# Patient Record
Sex: Male | Born: 1985 | Race: White | Hispanic: Yes | Marital: Married
Health system: Southern US, Community
[De-identification: ages and names within clinical notes are randomized; demographics above are authoritative.]

## PROBLEM LIST (undated history)

## (undated) DIAGNOSIS — E785 Hyperlipidemia, unspecified: Secondary | ICD-10-CM

## (undated) DIAGNOSIS — J302 Other seasonal allergic rhinitis: Secondary | ICD-10-CM

## (undated) HISTORY — PX: HERNIA REPAIR: SHX51

## (undated) HISTORY — PX: TONSILLECTOMY: SUR1361

## (undated) HISTORY — DX: Other seasonal allergic rhinitis: J30.2

## (undated) HISTORY — DX: Hyperlipidemia, unspecified: E78.5

---

## 2016-10-27 DIAGNOSIS — Z23 Encounter for immunization: Secondary | ICD-10-CM | POA: Diagnosis not present

## 2016-10-27 DIAGNOSIS — Z0000 Encounter for general adult medical examination without abnormal findings: Secondary | ICD-10-CM | POA: Diagnosis not present

## 2016-10-27 DIAGNOSIS — Z9229 Personal history of other drug therapy: Secondary | ICD-10-CM | POA: Diagnosis not present

## 2016-10-27 DIAGNOSIS — Z136 Encounter for screening for cardiovascular disorders: Secondary | ICD-10-CM | POA: Diagnosis not present

## 2018-04-19 DIAGNOSIS — F4323 Adjustment disorder with mixed anxiety and depressed mood: Secondary | ICD-10-CM | POA: Diagnosis not present

## 2018-05-17 DIAGNOSIS — J301 Allergic rhinitis due to pollen: Secondary | ICD-10-CM | POA: Diagnosis not present

## 2018-05-17 DIAGNOSIS — Z6837 Body mass index (BMI) 37.0-37.9, adult: Secondary | ICD-10-CM | POA: Diagnosis not present

## 2018-05-17 DIAGNOSIS — R000 Tachycardia, unspecified: Secondary | ICD-10-CM | POA: Diagnosis not present

## 2018-05-17 DIAGNOSIS — E785 Hyperlipidemia, unspecified: Secondary | ICD-10-CM | POA: Diagnosis not present

## 2018-05-24 DIAGNOSIS — F4323 Adjustment disorder with mixed anxiety and depressed mood: Secondary | ICD-10-CM | POA: Diagnosis not present

## 2018-08-06 DIAGNOSIS — H9201 Otalgia, right ear: Secondary | ICD-10-CM | POA: Diagnosis not present

## 2018-08-06 DIAGNOSIS — M5410 Radiculopathy, site unspecified: Secondary | ICD-10-CM | POA: Diagnosis not present

## 2018-11-10 DIAGNOSIS — Z0000 Encounter for general adult medical examination without abnormal findings: Secondary | ICD-10-CM | POA: Diagnosis not present

## 2018-11-10 DIAGNOSIS — Z23 Encounter for immunization: Secondary | ICD-10-CM | POA: Diagnosis not present

## 2018-11-10 DIAGNOSIS — E785 Hyperlipidemia, unspecified: Secondary | ICD-10-CM | POA: Diagnosis not present

## 2018-11-10 DIAGNOSIS — J301 Allergic rhinitis due to pollen: Secondary | ICD-10-CM | POA: Diagnosis not present

## 2018-11-10 DIAGNOSIS — Z6838 Body mass index (BMI) 38.0-38.9, adult: Secondary | ICD-10-CM | POA: Diagnosis not present

## 2018-11-10 DIAGNOSIS — Z8249 Family history of ischemic heart disease and other diseases of the circulatory system: Secondary | ICD-10-CM | POA: Diagnosis not present

## 2018-11-10 DIAGNOSIS — R000 Tachycardia, unspecified: Secondary | ICD-10-CM | POA: Diagnosis not present

## 2018-12-21 ENCOUNTER — Ambulatory Visit
Admission: RE | Admit: 2018-12-21 | Discharge: 2018-12-21 | Disposition: A | Discharge status: Home / Self Care | Payer: BC Managed Care – PPO | Source: Ambulatory Visit | Attending: Internal Medicine | Admitting: Internal Medicine

## 2018-12-21 ENCOUNTER — Other Ambulatory Visit: Payer: Self-pay | Admitting: Internal Medicine

## 2018-12-21 DIAGNOSIS — S99921S Unspecified injury of right foot, sequela: Secondary | ICD-10-CM

## 2018-12-21 DIAGNOSIS — M79671 Pain in right foot: Secondary | ICD-10-CM | POA: Diagnosis not present

## 2018-12-21 DIAGNOSIS — S99921A Unspecified injury of right foot, initial encounter: Secondary | ICD-10-CM | POA: Diagnosis not present

## 2019-05-25 DIAGNOSIS — E785 Hyperlipidemia, unspecified: Secondary | ICD-10-CM | POA: Diagnosis not present

## 2019-05-25 DIAGNOSIS — J301 Allergic rhinitis due to pollen: Secondary | ICD-10-CM | POA: Diagnosis not present

## 2019-05-25 DIAGNOSIS — Z79899 Other long term (current) drug therapy: Secondary | ICD-10-CM | POA: Diagnosis not present

## 2019-06-25 ENCOUNTER — Other Ambulatory Visit: Payer: Self-pay

## 2019-06-25 ENCOUNTER — Ambulatory Visit: Payer: Self-pay | Attending: Internal Medicine

## 2019-06-25 DIAGNOSIS — Z23 Encounter for immunization: Secondary | ICD-10-CM

## 2019-06-25 NOTE — Progress Notes (Signed)
   Covid-19 Vaccination Clinic  Name:  Gregory Lucas    MRN: [id number] DOB: 1985/04/01  06/25/2019  [name] was observed post Covid-19 immunization for 15 minutes without incident. He was provided with Vaccine Information Sheet and instruction to access the V-Safe system.   [name] was instructed to call 911 with any severe reactions post vaccine: Marland Kitchen Difficulty breathing  . Swelling of face and throat  . A fast heartbeat  . A bad rash all over body  . Dizziness and weakness   Immunizations Administered    Name Date Dose VIS Date Route   Pfizer COVID-19 Vaccine 06/25/2019 10:27 AM 0.3 mL 02/25/2019 Intramuscular   Manufacturer: [employer], [employer]   Lot: [id number]   NDC: [address]-6

## 2019-07-20 ENCOUNTER — Ambulatory Visit: Payer: Self-pay | Attending: Internal Medicine

## 2019-07-20 DIAGNOSIS — Z23 Encounter for immunization: Secondary | ICD-10-CM

## 2019-07-20 NOTE — Progress Notes (Signed)
   Covid-19 Vaccination Clinic  Name:  Gregory Lucas    MRN: [id number] DOB: 01/16/1986  07/20/2019  [name] was observed post Covid-19 immunization for 15 minutes without incident. He was provided with Vaccine Information Sheet and instruction to access the V-Safe system.   [name] was instructed to call 911 with any severe reactions post vaccine: Marland Kitchen Difficulty breathing  . Swelling of face and throat  . A fast heartbeat  . A bad rash all over body  . Dizziness and weakness   Immunizations Administered    Name Date Dose VIS Date Route   Pfizer COVID-19 Vaccine 07/20/2019  8:46 AM 0.3 mL 05/11/2018 Intramuscular   Manufacturer: [employer], [employer]   Lot: [id number]   NDC: [address]-5

## 2019-11-24 DIAGNOSIS — E785 Hyperlipidemia, unspecified: Secondary | ICD-10-CM | POA: Diagnosis not present

## 2019-11-24 DIAGNOSIS — Z23 Encounter for immunization: Secondary | ICD-10-CM | POA: Diagnosis not present

## 2019-11-24 DIAGNOSIS — Z0000 Encounter for general adult medical examination without abnormal findings: Secondary | ICD-10-CM | POA: Diagnosis not present

## 2020-11-26 DIAGNOSIS — E785 Hyperlipidemia, unspecified: Secondary | ICD-10-CM | POA: Diagnosis not present

## 2020-11-26 DIAGNOSIS — Z0000 Encounter for general adult medical examination without abnormal findings: Secondary | ICD-10-CM | POA: Diagnosis not present

## 2020-11-29 DIAGNOSIS — U071 COVID-19: Secondary | ICD-10-CM | POA: Diagnosis not present

## 2020-11-29 DIAGNOSIS — J018 Other acute sinusitis: Secondary | ICD-10-CM | POA: Diagnosis not present
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
## 2021-08-30 DIAGNOSIS — R030 Elevated blood-pressure reading, without diagnosis of hypertension: Secondary | ICD-10-CM | POA: Diagnosis not present

## 2021-08-30 DIAGNOSIS — M79661 Pain in right lower leg: Secondary | ICD-10-CM | POA: Diagnosis not present

## 2021-08-30 DIAGNOSIS — S81801A Unspecified open wound, right lower leg, initial encounter: Secondary | ICD-10-CM | POA: Diagnosis not present

## 2021-12-07 ENCOUNTER — Emergency Department (HOSPITAL_COMMUNITY): Payer: BC Managed Care – PPO

## 2021-12-07 ENCOUNTER — Encounter (HOSPITAL_COMMUNITY): Payer: Self-pay | Admitting: Emergency Medicine

## 2021-12-07 ENCOUNTER — Emergency Department (HOSPITAL_COMMUNITY)
Admission: EM | Admit: 2021-12-07 | Discharge: 2021-12-07 | Disposition: A | Discharge status: Home / Self Care | Payer: BC Managed Care – PPO | Attending: Emergency Medicine | Admitting: Emergency Medicine

## 2021-12-07 DIAGNOSIS — R109 Unspecified abdominal pain: Secondary | ICD-10-CM | POA: Insufficient documentation

## 2021-12-07 DIAGNOSIS — R918 Other nonspecific abnormal finding of lung field: Secondary | ICD-10-CM | POA: Diagnosis not present

## 2021-12-07 DIAGNOSIS — J40 Bronchitis, not specified as acute or chronic: Secondary | ICD-10-CM | POA: Diagnosis not present

## 2021-12-07 DIAGNOSIS — M545 Low back pain, unspecified: Secondary | ICD-10-CM | POA: Diagnosis not present

## 2021-12-07 DIAGNOSIS — R072 Precordial pain: Secondary | ICD-10-CM | POA: Insufficient documentation

## 2021-12-07 DIAGNOSIS — R0781 Pleurodynia: Secondary | ICD-10-CM | POA: Diagnosis not present

## 2021-12-07 DIAGNOSIS — M533 Sacrococcygeal disorders, not elsewhere classified: Secondary | ICD-10-CM | POA: Diagnosis not present

## 2021-12-07 DIAGNOSIS — Y92410 Unspecified street and highway as the place of occurrence of the external cause: Secondary | ICD-10-CM | POA: Insufficient documentation

## 2021-12-07 DIAGNOSIS — S3993XA Unspecified injury of pelvis, initial encounter: Secondary | ICD-10-CM | POA: Diagnosis not present

## 2021-12-07 LAB — CBC WITH DIFFERENTIAL/PLATELET
Abs Immature Granulocytes: 0.01 10*3/uL (ref 0.00–0.07)
Basophils Absolute: 0 10*3/uL (ref 0.0–0.1)
Basophils Relative: 1 %
Eosinophils Absolute: 0.1 10*3/uL (ref 0.0–0.5)
Eosinophils Relative: 1 %
HCT: 49.4 % (ref 39.0–52.0)
Hemoglobin: 16.2 g/dL (ref 13.0–17.0)
Immature Granulocytes: 0 %
Lymphocytes Relative: 29 %
Lymphs Abs: 1.5 10*3/uL (ref 0.7–4.0)
MCH: 29.5 pg (ref 26.0–34.0)
MCHC: 32.8 g/dL (ref 30.0–36.0)
MCV: 90 fL (ref 80.0–100.0)
Monocytes Absolute: 0.4 10*3/uL (ref 0.1–1.0)
Monocytes Relative: 8 %
Neutro Abs: 3.2 10*3/uL (ref 1.7–7.7)
Neutrophils Relative %: 61 %
Platelets: 224 10*3/uL (ref 150–400)
RBC: 5.49 MIL/uL (ref 4.22–5.81)
RDW: 12.3 % (ref 11.5–15.5)
WBC: 5.2 10*3/uL (ref 4.0–10.5)
nRBC: 0 % (ref 0.0–0.2)

## 2021-12-07 LAB — BASIC METABOLIC PANEL
Anion gap: 6 (ref 5–15)
BUN: 11 mg/dL (ref 6–20)
CO2: 28 mmol/L (ref 22–32)
Calcium: 9.7 mg/dL (ref 8.9–10.3)
Chloride: 106 mmol/L (ref 98–111)
Creatinine, Ser: 1 mg/dL (ref 0.61–1.24)
GFR, Estimated: >60 mL/min (ref >60)
Glucose, Bld: 95 mg/dL (ref 70–99)
Potassium: 3.7 mmol/L (ref 3.5–5.1)
Sodium: 140 mmol/L (ref 135–145)

## 2021-12-07 MED ORDER — METHOCARBAMOL 500 MG PO TABS: 500.0000 mg | ORAL_TABLET | Freq: Two times a day (BID) | ORAL | 0 refills | Status: AC

## 2021-12-07 MED ORDER — OXYCODONE-ACETAMINOPHEN 5-325 MG PO TABS
1.0000 | ORAL_TABLET | Freq: Once | ORAL | Status: AC
  Administered 2021-12-07: 1 via ORAL
  Filled 2021-12-07: qty 1
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
## 2021-12-11 ENCOUNTER — Other Ambulatory Visit: Payer: Self-pay | Admitting: Internal Medicine

## 2021-12-11 ENCOUNTER — Ambulatory Visit
Admission: RE | Admit: 2021-12-11 | Discharge: 2021-12-11 | Disposition: A | Discharge status: Home / Self Care | Payer: BC Managed Care – PPO | Source: Ambulatory Visit | Attending: Internal Medicine | Admitting: Internal Medicine

## 2021-12-11 DIAGNOSIS — R109 Unspecified abdominal pain: Secondary | ICD-10-CM | POA: Diagnosis not present

## 2021-12-11 DIAGNOSIS — K5730 Diverticulosis of large intestine without perforation or abscess without bleeding: Secondary | ICD-10-CM | POA: Diagnosis not present

## 2021-12-11 DIAGNOSIS — S301XXA Contusion of abdominal wall, initial encounter: Secondary | ICD-10-CM | POA: Diagnosis not present

## 2021-12-11 DIAGNOSIS — N289 Disorder of kidney and ureter, unspecified: Secondary | ICD-10-CM | POA: Diagnosis not present

## 2021-12-11 DIAGNOSIS — I700 Atherosclerosis of aorta: Secondary | ICD-10-CM | POA: Diagnosis not present

## 2021-12-11 DIAGNOSIS — M791 Myalgia, unspecified site: Secondary | ICD-10-CM | POA: Diagnosis not present

## 2021-12-11 DIAGNOSIS — M533 Sacrococcygeal disorders, not elsewhere classified: Secondary | ICD-10-CM | POA: Diagnosis not present

## 2021-12-11 MED ORDER — IOPAMIDOL (ISOVUE-300) INJECTION 61%
100.0000 mL | Freq: Once | INTRAVENOUS | Status: AC | PRN
  Administered 2021-12-11: 100 mL via INTRAVENOUS

## 2022-01-16 DIAGNOSIS — M791 Myalgia, unspecified site: Secondary | ICD-10-CM | POA: Diagnosis not present

## 2022-01-16 DIAGNOSIS — Z0000 Encounter for general adult medical examination without abnormal findings: Secondary | ICD-10-CM | POA: Diagnosis not present

## 2022-01-16 DIAGNOSIS — Z23 Encounter for immunization: Secondary | ICD-10-CM | POA: Diagnosis not present

## 2022-02-12 DIAGNOSIS — F411 Generalized anxiety disorder: Secondary | ICD-10-CM | POA: Diagnosis not present

## 2022-02-12 DIAGNOSIS — F330 Major depressive disorder, recurrent, mild: Secondary | ICD-10-CM | POA: Diagnosis not present

## 2022-02-19 DIAGNOSIS — F411 Generalized anxiety disorder: Secondary | ICD-10-CM | POA: Diagnosis not present

## 2022-02-19 DIAGNOSIS — F330 Major depressive disorder, recurrent, mild: Secondary | ICD-10-CM | POA: Diagnosis not present

## 2022-02-20 DIAGNOSIS — F4323 Adjustment disorder with mixed anxiety and depressed mood: Secondary | ICD-10-CM | POA: Diagnosis not present

## 2022-02-26 DIAGNOSIS — F411 Generalized anxiety disorder: Secondary | ICD-10-CM | POA: Diagnosis not present

## 2022-02-26 DIAGNOSIS — F330 Major depressive disorder, recurrent, mild: Secondary | ICD-10-CM | POA: Diagnosis not present

## 2022-03-19 DIAGNOSIS — F411 Generalized anxiety disorder: Secondary | ICD-10-CM | POA: Diagnosis not present

## 2022-03-19 DIAGNOSIS — F330 Major depressive disorder, recurrent, mild: Secondary | ICD-10-CM | POA: Diagnosis not present

## 2022-03-29 DIAGNOSIS — F411 Generalized anxiety disorder: Secondary | ICD-10-CM | POA: Diagnosis not present

## 2022-03-29 DIAGNOSIS — F330 Major depressive disorder, recurrent, mild: Secondary | ICD-10-CM | POA: Diagnosis not present

## 2022-04-05 DIAGNOSIS — F330 Major depressive disorder, recurrent, mild: Secondary | ICD-10-CM | POA: Diagnosis not present

## 2022-04-05 DIAGNOSIS — F411 Generalized anxiety disorder: Secondary | ICD-10-CM | POA: Diagnosis not present

## 2022-04-09 DIAGNOSIS — F330 Major depressive disorder, recurrent, mild: Secondary | ICD-10-CM | POA: Diagnosis not present

## 2022-04-09 DIAGNOSIS — F411 Generalized anxiety disorder: Secondary | ICD-10-CM | POA: Diagnosis not present

## 2022-04-23 DIAGNOSIS — F330 Major depressive disorder, recurrent, mild: Secondary | ICD-10-CM | POA: Diagnosis not present

## 2022-04-23 DIAGNOSIS — E785 Hyperlipidemia, unspecified: Secondary | ICD-10-CM | POA: Diagnosis not present

## 2022-04-23 DIAGNOSIS — F411 Generalized anxiety disorder: Secondary | ICD-10-CM | POA: Diagnosis not present

## 2022-05-03 DIAGNOSIS — F330 Major depressive disorder, recurrent, mild: Secondary | ICD-10-CM | POA: Diagnosis not present

## 2022-05-03 DIAGNOSIS — F411 Generalized anxiety disorder: Secondary | ICD-10-CM | POA: Diagnosis not present

## 2022-05-10 DIAGNOSIS — F411 Generalized anxiety disorder: Secondary | ICD-10-CM | POA: Diagnosis not present

## 2022-05-10 DIAGNOSIS — F330 Major depressive disorder, recurrent, mild: Secondary | ICD-10-CM | POA: Diagnosis not present

## 2022-05-17 DIAGNOSIS — F411 Generalized anxiety disorder: Secondary | ICD-10-CM | POA: Diagnosis not present

## 2022-05-17 DIAGNOSIS — F330 Major depressive disorder, recurrent, mild: Secondary | ICD-10-CM | POA: Diagnosis not present

## 2022-05-21 DIAGNOSIS — F411 Generalized anxiety disorder: Secondary | ICD-10-CM | POA: Diagnosis not present

## 2022-05-21 DIAGNOSIS — F330 Major depressive disorder, recurrent, mild: Secondary | ICD-10-CM | POA: Diagnosis not present

## 2022-05-31 DIAGNOSIS — F411 Generalized anxiety disorder: Secondary | ICD-10-CM | POA: Diagnosis not present

## 2022-05-31 DIAGNOSIS — F330 Major depressive disorder, recurrent, mild: Secondary | ICD-10-CM | POA: Diagnosis not present

## 2022-06-04 DIAGNOSIS — F330 Major depressive disorder, recurrent, mild: Secondary | ICD-10-CM | POA: Diagnosis not present

## 2022-06-04 DIAGNOSIS — F411 Generalized anxiety disorder: Secondary | ICD-10-CM | POA: Diagnosis not present

## 2022-06-18 DIAGNOSIS — F330 Major depressive disorder, recurrent, mild: Secondary | ICD-10-CM | POA: Diagnosis not present

## 2022-06-18 DIAGNOSIS — F411 Generalized anxiety disorder: Secondary | ICD-10-CM | POA: Diagnosis not present

## 2022-06-28 DIAGNOSIS — F330 Major depressive disorder, recurrent, mild: Secondary | ICD-10-CM | POA: Diagnosis not present

## 2022-06-28 DIAGNOSIS — F411 Generalized anxiety disorder: Secondary | ICD-10-CM | POA: Diagnosis not present

## 2022-07-09 DIAGNOSIS — F330 Major depressive disorder, recurrent, mild: Secondary | ICD-10-CM | POA: Diagnosis not present

## 2022-07-09 DIAGNOSIS — F411 Generalized anxiety disorder: Secondary | ICD-10-CM | POA: Diagnosis not present

## 2022-07-23 DIAGNOSIS — F411 Generalized anxiety disorder: Secondary | ICD-10-CM | POA: Diagnosis not present

## 2022-07-23 DIAGNOSIS — F330 Major depressive disorder, recurrent, mild: Secondary | ICD-10-CM | POA: Diagnosis not present

## 2022-07-30 DIAGNOSIS — F411 Generalized anxiety disorder: Secondary | ICD-10-CM | POA: Diagnosis not present

## 2022-07-30 DIAGNOSIS — F330 Major depressive disorder, recurrent, mild: Secondary | ICD-10-CM | POA: Diagnosis not present

## 2022-08-06 DIAGNOSIS — F330 Major depressive disorder, recurrent, mild: Secondary | ICD-10-CM | POA: Diagnosis not present

## 2022-08-06 DIAGNOSIS — F411 Generalized anxiety disorder: Secondary | ICD-10-CM | POA: Diagnosis not present

## 2022-08-21 DIAGNOSIS — F330 Major depressive disorder, recurrent, mild: Secondary | ICD-10-CM | POA: Diagnosis not present

## 2022-08-21 DIAGNOSIS — F411 Generalized anxiety disorder: Secondary | ICD-10-CM | POA: Diagnosis not present

## 2022-08-30 DIAGNOSIS — F411 Generalized anxiety disorder: Secondary | ICD-10-CM | POA: Diagnosis not present

## 2022-08-30 DIAGNOSIS — F330 Major depressive disorder, recurrent, mild: Secondary | ICD-10-CM | POA: Diagnosis not present

## 2022-09-11 DIAGNOSIS — F411 Generalized anxiety disorder: Secondary | ICD-10-CM | POA: Diagnosis not present

## 2022-09-11 DIAGNOSIS — F330 Major depressive disorder, recurrent, mild: Secondary | ICD-10-CM | POA: Diagnosis not present

## 2022-10-05 DIAGNOSIS — F411 Generalized anxiety disorder: Secondary | ICD-10-CM | POA: Diagnosis not present

## 2022-10-05 DIAGNOSIS — F330 Major depressive disorder, recurrent, mild: Secondary | ICD-10-CM | POA: Diagnosis not present

## 2022-10-25 DIAGNOSIS — F411 Generalized anxiety disorder: Secondary | ICD-10-CM | POA: Diagnosis not present

## 2022-10-25 DIAGNOSIS — F330 Major depressive disorder, recurrent, mild: Secondary | ICD-10-CM | POA: Diagnosis not present

## 2022-10-27 DIAGNOSIS — F3341 Major depressive disorder, recurrent, in partial remission: Secondary | ICD-10-CM | POA: Diagnosis not present

## 2022-10-27 DIAGNOSIS — E785 Hyperlipidemia, unspecified: Secondary | ICD-10-CM | POA: Diagnosis not present

## 2022-10-27 DIAGNOSIS — M79606 Pain in leg, unspecified: Secondary | ICD-10-CM | POA: Diagnosis not present

## 2022-10-27 DIAGNOSIS — E669 Obesity, unspecified: Secondary | ICD-10-CM | POA: Diagnosis not present

## 2022-11-01 DIAGNOSIS — F411 Generalized anxiety disorder: Secondary | ICD-10-CM | POA: Diagnosis not present

## 2022-11-01 DIAGNOSIS — F330 Major depressive disorder, recurrent, mild: Secondary | ICD-10-CM | POA: Diagnosis not present

## 2022-11-05 DIAGNOSIS — F330 Major depressive disorder, recurrent, mild: Secondary | ICD-10-CM | POA: Diagnosis not present

## 2022-11-05 DIAGNOSIS — F411 Generalized anxiety disorder: Secondary | ICD-10-CM | POA: Diagnosis not present

## 2022-11-25 DIAGNOSIS — R748 Abnormal levels of other serum enzymes: Secondary | ICD-10-CM | POA: Diagnosis not present

## 2022-11-25 DIAGNOSIS — M79606 Pain in leg, unspecified: Secondary | ICD-10-CM | POA: Diagnosis not present

## 2022-11-29 DIAGNOSIS — F330 Major depressive disorder, recurrent, mild: Secondary | ICD-10-CM | POA: Diagnosis not present

## 2022-11-29 DIAGNOSIS — F411 Generalized anxiety disorder: Secondary | ICD-10-CM | POA: Diagnosis not present

## 2022-12-03 DIAGNOSIS — E785 Hyperlipidemia, unspecified: Secondary | ICD-10-CM | POA: Diagnosis not present

## 2022-12-03 DIAGNOSIS — R748 Abnormal levels of other serum enzymes: Secondary | ICD-10-CM | POA: Diagnosis not present

## 2022-12-03 DIAGNOSIS — Z23 Encounter for immunization: Secondary | ICD-10-CM | POA: Diagnosis not present

## 2022-12-03 DIAGNOSIS — R252 Cramp and spasm: Secondary | ICD-10-CM | POA: Diagnosis not present

## 2022-12-20 DIAGNOSIS — F330 Major depressive disorder, recurrent, mild: Secondary | ICD-10-CM | POA: Diagnosis not present

## 2022-12-20 DIAGNOSIS — F411 Generalized anxiety disorder: Secondary | ICD-10-CM | POA: Diagnosis not present

## 2022-12-24 DIAGNOSIS — F411 Generalized anxiety disorder: Secondary | ICD-10-CM | POA: Diagnosis not present

## 2022-12-24 DIAGNOSIS — F330 Major depressive disorder, recurrent, mild: Secondary | ICD-10-CM | POA: Diagnosis not present

## 2023-01-03 DIAGNOSIS — F411 Generalized anxiety disorder: Secondary | ICD-10-CM | POA: Diagnosis not present

## 2023-01-03 DIAGNOSIS — F330 Major depressive disorder, recurrent, mild: Secondary | ICD-10-CM | POA: Diagnosis not present

## 2023-01-18 DIAGNOSIS — F330 Major depressive disorder, recurrent, mild: Secondary | ICD-10-CM | POA: Diagnosis not present

## 2023-01-18 DIAGNOSIS — F411 Generalized anxiety disorder: Secondary | ICD-10-CM | POA: Diagnosis not present

## 2023-01-22 DIAGNOSIS — F330 Major depressive disorder, recurrent, mild: Secondary | ICD-10-CM | POA: Diagnosis not present

## 2023-01-22 DIAGNOSIS — F411 Generalized anxiety disorder: Secondary | ICD-10-CM | POA: Diagnosis not present

## 2023-01-31 DIAGNOSIS — F411 Generalized anxiety disorder: Secondary | ICD-10-CM | POA: Diagnosis not present

## 2023-01-31 DIAGNOSIS — F330 Major depressive disorder, recurrent, mild: Secondary | ICD-10-CM | POA: Diagnosis not present

## 2023-02-02 DIAGNOSIS — Z0000 Encounter for general adult medical examination without abnormal findings: Secondary | ICD-10-CM | POA: Diagnosis not present

## 2023-02-02 DIAGNOSIS — E785 Hyperlipidemia, unspecified: Secondary | ICD-10-CM | POA: Diagnosis not present

## 2023-02-02 DIAGNOSIS — J301 Allergic rhinitis due to pollen: Secondary | ICD-10-CM | POA: Diagnosis not present

## 2023-02-02 DIAGNOSIS — F3341 Major depressive disorder, recurrent, in partial remission: Secondary | ICD-10-CM | POA: Diagnosis not present

## 2023-02-05 DIAGNOSIS — F330 Major depressive disorder, recurrent, mild: Secondary | ICD-10-CM | POA: Diagnosis not present

## 2023-02-05 DIAGNOSIS — F411 Generalized anxiety disorder: Secondary | ICD-10-CM | POA: Diagnosis not present

## 2023-02-26 DIAGNOSIS — E559 Vitamin D deficiency, unspecified: Secondary | ICD-10-CM | POA: Diagnosis not present

## 2023-02-26 DIAGNOSIS — R7989 Other specified abnormal findings of blood chemistry: Secondary | ICD-10-CM | POA: Diagnosis not present

## 2023-02-28 DIAGNOSIS — F411 Generalized anxiety disorder: Secondary | ICD-10-CM | POA: Diagnosis not present

## 2023-02-28 DIAGNOSIS — F330 Major depressive disorder, recurrent, mild: Secondary | ICD-10-CM | POA: Diagnosis not present

## 2023-03-26 DIAGNOSIS — F330 Major depressive disorder, recurrent, mild: Secondary | ICD-10-CM | POA: Diagnosis not present

## 2023-03-26 DIAGNOSIS — F411 Generalized anxiety disorder: Secondary | ICD-10-CM | POA: Diagnosis not present

## 2023-04-08 DIAGNOSIS — F330 Major depressive disorder, recurrent, mild: Secondary | ICD-10-CM | POA: Diagnosis not present

## 2023-04-08 DIAGNOSIS — F411 Generalized anxiety disorder: Secondary | ICD-10-CM | POA: Diagnosis not present

## 2023-04-22 DIAGNOSIS — F411 Generalized anxiety disorder: Secondary | ICD-10-CM | POA: Diagnosis not present

## 2023-04-22 DIAGNOSIS — F330 Major depressive disorder, recurrent, mild: Secondary | ICD-10-CM | POA: Diagnosis not present

## 2023-05-06 DIAGNOSIS — E785 Hyperlipidemia, unspecified: Secondary | ICD-10-CM | POA: Diagnosis not present

## 2023-05-06 DIAGNOSIS — F3341 Major depressive disorder, recurrent, in partial remission: Secondary | ICD-10-CM | POA: Diagnosis not present

## 2023-05-16 DIAGNOSIS — F330 Major depressive disorder, recurrent, mild: Secondary | ICD-10-CM | POA: Diagnosis not present

## 2023-05-16 DIAGNOSIS — F411 Generalized anxiety disorder: Secondary | ICD-10-CM | POA: Diagnosis not present

## 2023-07-14 DIAGNOSIS — E785 Hyperlipidemia, unspecified: Secondary | ICD-10-CM | POA: Diagnosis not present

## 2023-07-14 DIAGNOSIS — M79606 Pain in leg, unspecified: Secondary | ICD-10-CM | POA: Diagnosis not present

## 2023-11-09 DIAGNOSIS — E785 Hyperlipidemia, unspecified: Secondary | ICD-10-CM | POA: Diagnosis not present

## 2023-11-09 DIAGNOSIS — R252 Cramp and spasm: Secondary | ICD-10-CM | POA: Diagnosis not present

## 2024-03-01 DIAGNOSIS — J301 Allergic rhinitis due to pollen: Secondary | ICD-10-CM | POA: Diagnosis not present

## 2024-03-01 DIAGNOSIS — Z23 Encounter for immunization: Secondary | ICD-10-CM | POA: Diagnosis not present

## 2024-03-01 DIAGNOSIS — F3341 Major depressive disorder, recurrent, in partial remission: Secondary | ICD-10-CM | POA: Diagnosis not present

## 2024-03-01 DIAGNOSIS — E559 Vitamin D deficiency, unspecified: Secondary | ICD-10-CM | POA: Diagnosis not present

## 2024-03-01 DIAGNOSIS — Z0000 Encounter for general adult medical examination without abnormal findings: Secondary | ICD-10-CM | POA: Diagnosis not present

## 2024-03-01 DIAGNOSIS — E785 Hyperlipidemia, unspecified: Secondary | ICD-10-CM | POA: Diagnosis not present
